# Patient Record
Sex: Male | Born: 1965 | Race: White | Marital: Single | Smoking: Never smoker
Health system: Southern US, Community
[De-identification: ages and names within clinical notes are randomized; demographics above are authoritative.]

## PROBLEM LIST (undated history)

## (undated) HISTORY — PX: CERVICAL SPINE SURGERY: SHX589

## (undated) HISTORY — PX: NASAL SEPTUM SURGERY: SHX37

---

## 2011-08-11 DIAGNOSIS — C4491 Basal cell carcinoma of skin, unspecified: Secondary | ICD-10-CM

## 2011-08-11 HISTORY — DX: Basal cell carcinoma of skin, unspecified: C44.91

## 2012-05-31 DIAGNOSIS — C439 Malignant melanoma of skin, unspecified: Secondary | ICD-10-CM

## 2012-05-31 HISTORY — DX: Malignant melanoma of skin, unspecified: C43.9

## 2012-09-29 ENCOUNTER — Ambulatory Visit
Admission: RE | Admit: 2012-09-29 | Discharge: 2012-09-29 | Disposition: A | Discharge status: Home / Self Care | Payer: BC Managed Care – PPO | Source: Ambulatory Visit | Attending: Family Medicine | Admitting: Family Medicine

## 2012-09-29 ENCOUNTER — Other Ambulatory Visit: Payer: Self-pay | Admitting: Family Medicine

## 2012-09-29 DIAGNOSIS — S300XXA Contusion of lower back and pelvis, initial encounter: Secondary | ICD-10-CM

## 2012-09-29 DIAGNOSIS — W19XXXA Unspecified fall, initial encounter: Secondary | ICD-10-CM

## 2015-02-05 ENCOUNTER — Other Ambulatory Visit: Payer: Self-pay | Admitting: Family Medicine

## 2015-02-05 DIAGNOSIS — M5412 Radiculopathy, cervical region: Secondary | ICD-10-CM

## 2015-02-11 ENCOUNTER — Other Ambulatory Visit: Payer: Self-pay

## 2015-05-08 ENCOUNTER — Other Ambulatory Visit: Payer: Self-pay | Admitting: Family Medicine

## 2015-05-08 DIAGNOSIS — M5412 Radiculopathy, cervical region: Secondary | ICD-10-CM

## 2015-05-09 ENCOUNTER — Ambulatory Visit
Admission: RE | Admit: 2015-05-09 | Discharge: 2015-05-09 | Disposition: A | Discharge status: Home / Self Care | Payer: BLUE CROSS/BLUE SHIELD | Source: Ambulatory Visit | Attending: Family Medicine | Admitting: Family Medicine

## 2015-05-09 DIAGNOSIS — M5412 Radiculopathy, cervical region: Secondary | ICD-10-CM

## 2015-08-11 ENCOUNTER — Ambulatory Visit
Admission: RE | Admit: 2015-08-11 | Discharge: 2015-08-11 | Disposition: A | Discharge status: Home / Self Care | Payer: BLUE CROSS/BLUE SHIELD | Source: Ambulatory Visit | Attending: Family Medicine | Admitting: Family Medicine

## 2015-08-11 ENCOUNTER — Other Ambulatory Visit: Payer: Self-pay | Admitting: Family Medicine

## 2015-08-11 DIAGNOSIS — R059 Cough, unspecified: Secondary | ICD-10-CM

## 2015-08-11 DIAGNOSIS — R05 Cough: Secondary | ICD-10-CM

## 2016-02-02 ENCOUNTER — Emergency Department (HOSPITAL_COMMUNITY)
Admission: EM | Admit: 2016-02-02 | Discharge: 2016-02-02 | Disposition: A | Discharge status: Home / Self Care | Payer: BLUE CROSS/BLUE SHIELD | Attending: Emergency Medicine | Admitting: Emergency Medicine

## 2016-02-02 ENCOUNTER — Encounter (HOSPITAL_COMMUNITY): Payer: Self-pay | Admitting: *Deleted

## 2016-02-02 DIAGNOSIS — R1013 Epigastric pain: Secondary | ICD-10-CM | POA: Diagnosis not present

## 2016-02-02 LAB — URINALYSIS, ROUTINE W REFLEX MICROSCOPIC
Bilirubin Urine: NEGATIVE
Glucose, UA: NEGATIVE mg/dL
Hgb urine dipstick: NEGATIVE
Ketones, ur: NEGATIVE mg/dL
Leukocytes, UA: NEGATIVE
Nitrite: NEGATIVE
Protein, ur: NEGATIVE mg/dL
Specific Gravity, Urine: 1.02 (ref 1.005–1.030)
pH: 7.5 (ref 5.0–8.0)

## 2016-02-02 LAB — COMPREHENSIVE METABOLIC PANEL
ALBUMIN: 3.8 g/dL (ref 3.5–5.0)
ALT: 28 U/L (ref 17–63)
ANION GAP: 8 (ref 5–15)
AST: 24 U/L (ref 15–41)
Alkaline Phosphatase: 80 U/L (ref 38–126)
BUN: 9 mg/dL (ref 6–20)
CALCIUM: 9.8 mg/dL (ref 8.9–10.3)
CHLORIDE: 106 mmol/L (ref 101–111)
CO2: 24 mmol/L (ref 22–32)
Creatinine, Ser: 1 mg/dL (ref 0.61–1.24)
GFR calc non Af Amer: >60 mL/min (ref >60)
GLUCOSE: 124 mg/dL — AB (ref 65–99)
POTASSIUM: 3.8 mmol/L (ref 3.5–5.1)
SODIUM: 138 mmol/L (ref 135–145)
Total Bilirubin: 0.5 mg/dL (ref 0.3–1.2)
Total Protein: 6.6 g/dL (ref 6.5–8.1)

## 2016-02-02 LAB — CBC
HEMATOCRIT: 44.4 % (ref 39.0–52.0)
HEMOGLOBIN: 15.2 g/dL (ref 13.0–17.0)
MCH: 29.2 pg (ref 26.0–34.0)
MCHC: 34.2 g/dL (ref 30.0–36.0)
MCV: 85.2 fL (ref 78.0–100.0)
Platelets: 220 10*3/uL (ref 150–400)
RBC: 5.21 MIL/uL (ref 4.22–5.81)
RDW: 12.3 % (ref 11.5–15.5)
WBC: 9 10*3/uL (ref 4.0–10.5)

## 2016-02-02 LAB — LIPASE, BLOOD: LIPASE: 39 U/L (ref 11–51)

## 2016-02-02 MED ORDER — RANITIDINE HCL 150 MG/10ML PO SYRP
300.0000 mg | ORAL_SOLUTION | Freq: Once | ORAL | Status: AC
  Administered 2016-02-02: 300 mg via ORAL
  Filled 2016-02-02: qty 20

## 2016-02-02 MED ORDER — SUCRALFATE 1 GM/10ML PO SUSP
1.0000 g | Freq: Once | ORAL | Status: AC
  Administered 2016-02-02: 1 g via ORAL
  Filled 2016-02-02: qty 10

## 2016-02-02 MED ORDER — SODIUM CHLORIDE 0.9 % IV BOLUS (SEPSIS)
1000.0000 mL | Freq: Once | INTRAVENOUS | Status: AC
  Administered 2016-02-02: 1000 mL via INTRAVENOUS

## 2016-02-02 MED ORDER — DICYCLOMINE HCL 10 MG PO CAPS
20.0000 mg | ORAL_CAPSULE | Freq: Once | ORAL | Status: AC
  Administered 2016-02-02: 20 mg via ORAL
  Filled 2016-02-02: qty 2

## 2016-02-02 NOTE — Discharge Instructions (Signed)
Abdominal Pain, Adult Jimmy Dominguez, your blood work tonight was normal.  Consider taking ranitidine over the counter as needed for your pain and see GI within 3 days for close follow up and further evaluation.  If any symptoms worsen, come back to the ED immediately. Thank you. Many things can cause belly (abdominal) pain. Most times, the belly pain is not dangerous. Many cases of belly pain can be watched and treated at home. HOME CARE   Do not take medicines that help you go poop (laxatives) unless told to by your doctor.  Only take medicine as told by your doctor.  Eat or drink as told by your doctor. Your doctor will tell you if you should be on a special diet. GET HELP IF:  You do not know what is causing your belly pain.  You have belly pain while you are sick to your stomach (nauseous) or have runny poop (diarrhea).  You have pain while you pee or poop.  Your belly pain wakes you up at night.  You have belly pain that gets worse or better when you eat.  You have belly pain that gets worse when you eat fatty foods.  You have a fever. GET HELP RIGHT AWAY IF:   The pain does not go away within 2 hours.  You keep throwing up (vomiting).  The pain changes and is only in the right or left part of the belly.  You have bloody or tarry looking poop. MAKE SURE YOU:   Understand these instructions.  Will watch your condition.  Will get help right away if you are not doing well or get worse.   This information is not intended to replace advice given to you by your health care provider. Make sure you discuss any questions you have with your health care provider.   Document Released: 02/02/2008 Document Revised: 09/06/2014 Document Reviewed: 04/25/2013 Elsevier Interactive Patient Education [employer].

## 2016-02-02 NOTE — ED Notes (Addendum)
Handed pt urinal to request sample, pt throws urinal on floor. Explained to pt that we would like a sample and handed to pt again, pt throws urinal on floor again.

## 2016-02-02 NOTE — ED Notes (Signed)
Pt c/o epigastric pain onset last night that subsided. Pt woke up this morning with severe pain unrelieved with Tums or nexium.

## 2016-02-02 NOTE — ED Provider Notes (Signed)
CSN: [id number]     Arrival date & time 02/02/16  [id number] History  By signing my name below, I, Rowan Blase, attest that this documentation has been prepared under the direction and in the presence of Everlene Balls, MD . Electronically Signed: Rowan Blase, Scribe. 02/02/2016. 3:56 AM.   Chief Complaint  Patient presents with  . Abdominal Pain   The history is provided by the patient. No language interpreter was used.   HPI Comments:  Jimmy Dominguez is a 50 y.o. male who presents to the Emergency Department complaining of "severe" burning epigastric abdominal pain onset ~5 hours ago while resting. Pt states pain might have been aggravated by food but his fiancee ate food from the same place without pain. No alleviating factors noted or treatments attempted PTA. Denies nausea, vomiting, diarrhea, dysuria, or h/o abdominal surgery.   History reviewed. No pertinent past medical history. Past Surgical History  Procedure Laterality Date  . Cervical spine surgery    . Nasal septum surgery     History reviewed. No pertinent family history. Social History  Substance Use Topics  . Smoking status: Never Smoker   . Smokeless tobacco: None  . Alcohol Use: Yes    Review of Systems  Gastrointestinal: Positive for abdominal pain. Negative for nausea, vomiting and diarrhea.  Genitourinary: Negative for dysuria.  All other systems reviewed and are negative.   Allergies  Review of patient's allergies indicates no known allergies.  Home Medications   Prior to Admission medications   Not on File   BP 141/79 mmHg  Pulse 67  Temp(Src) 97.6 F (36.4 C)  Resp 18  Ht 5\' 10"  (1.778 m)  Wt 216 lb (97.977 kg)  BMI 30.99 kg/m2  SpO2 99%   Physical Exam  Constitutional: He is oriented to person, place, and time. Vital signs are normal. He appears well-developed and well-nourished.  Non-toxic appearance. He does not appear ill. No distress.  HENT:  Head: Normocephalic and atraumatic.  Nose:  Nose normal.  Mouth/Throat: Oropharynx is clear and moist. No oropharyngeal exudate.  Eyes: Conjunctivae and EOM are normal. Pupils are equal, round, and reactive to light. No scleral icterus.  Neck: Normal range of motion. Neck supple. No tracheal deviation, no edema, no erythema and normal range of motion present. No thyroid mass and no thyromegaly present.  Cardiovascular: Normal rate, regular rhythm, S1 normal, S2 normal, normal heart sounds, intact distal pulses and normal pulses.  Exam reveals no gallop and no friction rub.   No murmur heard. Pulmonary/Chest: Effort normal and breath sounds normal. No respiratory distress. He has no wheezes. He has no rhonchi. He has no rales.  Abdominal: Soft. Normal appearance and bowel sounds are normal. He exhibits no distension, no ascites and no mass. There is no hepatosplenomegaly. There is tenderness. There is no rebound, no guarding and no CVA tenderness.  Mid epigastric TTP  Musculoskeletal: Normal range of motion. He exhibits no edema or tenderness.  Lymphadenopathy:    He has no cervical adenopathy.  Neurological: He is alert and oriented to person, place, and time. He has normal strength. No cranial nerve deficit or sensory deficit.  Skin: Skin is warm, dry and intact. No petechiae and no rash noted. He is not diaphoretic. No erythema. No pallor.  Psychiatric: He has a normal mood and affect. His behavior is normal. Judgment normal.  Nursing note and vitals reviewed.   ED Course  Procedures  DIAGNOSTIC STUDIES:  Oxygen Saturation is 99% on RA, normal by  my interpretation.    COORDINATION OF CARE:  3:55 AM Will order blood work. Discussed treatment plan with pt at bedside and pt agreed to plan.  Labs Review Labs Reviewed  COMPREHENSIVE METABOLIC PANEL - Abnormal; Notable for the following:    Glucose, Bld 124 (*)    All other components within normal limits  LIPASE, BLOOD  CBC  URINALYSIS, ROUTINE W REFLEX MICROSCOPIC (NOT AT  [facility])    Imaging Review No results found. I have personally reviewed and evaluated these images and lab results as part of my medical decision-making.   EKG Interpretation None      MDM   Final diagnoses:  None   Patient presents to the ED for midepigastric abdominal pain after a meal.  History concerning for gastritis. He was given bentyl, ranitidine, sucralfate.  Labs are unremarkable.    Upon repeat evaluation, patient states his pain has significantly improved.  Education provided regarding gastritis.  He states he has fu with GI within 1 week for colonoscopy, he was encouraged to enquire about EGD.  He appears well and in NAD. VS remain within his normal limits and he i safe for DC.   I personally performed the services described in this documentation, which was scribed in my presence. The recorded information has been reviewed and is accurate.      Everlene Balls, MD 02/02/16 [phone]

## 2016-02-05 ENCOUNTER — Telehealth: Payer: Self-pay | Admitting: Gastroenterology

## 2016-02-05 DIAGNOSIS — Z1211 Encounter for screening for malignant neoplasm of colon: Secondary | ICD-10-CM | POA: Diagnosis not present

## 2016-02-05 DIAGNOSIS — Z01818 Encounter for other preprocedural examination: Secondary | ICD-10-CM | POA: Diagnosis not present

## 2016-02-05 DIAGNOSIS — R1013 Epigastric pain: Secondary | ICD-10-CM | POA: Diagnosis not present

## 2016-02-05 NOTE — Telephone Encounter (Signed)
Doc of Day  Urgent Referral in EPIC from [facility] ED wanting the patient seen in 3 days.  We are unassigned for [facility].  advise

## 2016-02-05 NOTE — Telephone Encounter (Signed)
I spoke with Jimmy Dominguez.  He states he has a referral and an appt to another GI group.  He declines to schedule.

## 2016-02-27 DIAGNOSIS — Z1211 Encounter for screening for malignant neoplasm of colon: Secondary | ICD-10-CM | POA: Diagnosis not present

## 2016-02-27 DIAGNOSIS — R933 Abnormal findings on diagnostic imaging of other parts of digestive tract: Secondary | ICD-10-CM | POA: Diagnosis not present

## 2016-02-27 DIAGNOSIS — K640 First degree hemorrhoids: Secondary | ICD-10-CM | POA: Diagnosis not present

## 2016-02-27 DIAGNOSIS — R1013 Epigastric pain: Secondary | ICD-10-CM | POA: Diagnosis not present

## 2016-07-28 DIAGNOSIS — H5212 Myopia, left eye: Secondary | ICD-10-CM | POA: Diagnosis not present

## 2016-07-28 DIAGNOSIS — H524 Presbyopia: Secondary | ICD-10-CM | POA: Diagnosis not present

## 2016-10-07 DIAGNOSIS — Z0000 Encounter for general adult medical examination without abnormal findings: Secondary | ICD-10-CM | POA: Diagnosis not present

## 2016-10-07 DIAGNOSIS — E782 Mixed hyperlipidemia: Secondary | ICD-10-CM | POA: Diagnosis not present

## 2016-10-07 DIAGNOSIS — Z125 Encounter for screening for malignant neoplasm of prostate: Secondary | ICD-10-CM | POA: Diagnosis not present

## 2017-04-16 DIAGNOSIS — W208XXA Other cause of strike by thrown, projected or falling object, initial encounter: Secondary | ICD-10-CM | POA: Diagnosis not present

## 2017-04-16 DIAGNOSIS — Y998 Other external cause status: Secondary | ICD-10-CM | POA: Diagnosis not present

## 2017-04-16 DIAGNOSIS — Y9389 Activity, other specified: Secondary | ICD-10-CM | POA: Diagnosis not present

## 2017-04-16 DIAGNOSIS — Y9289 Other specified places as the place of occurrence of the external cause: Secondary | ICD-10-CM | POA: Diagnosis not present

## 2017-04-16 DIAGNOSIS — S0181XA Laceration without foreign body of other part of head, initial encounter: Secondary | ICD-10-CM | POA: Diagnosis not present

## 2017-05-31 IMAGING — MR MR CERVICAL SPINE W/O CM
5 series · 29 of 48 positions shown · non-contrast
Comparison: None.

CLINICAL DATA: Right-sided neck, shoulder, and upper back pain
beginning 5 months ago. The pain is progressive.

EXAM:
MRI CERVICAL SPINE WITHOUT CONTRAST
TECHNIQUE: Multiplanar, multisequence MR imaging of the cervical spine was
performed. No intravenous contrast was administered.

[Series 2: T2 · sagittal · 3.3mm · 0.41mm/px · 6 of 12 slices shown (1 of 2)]
[im 1/12]
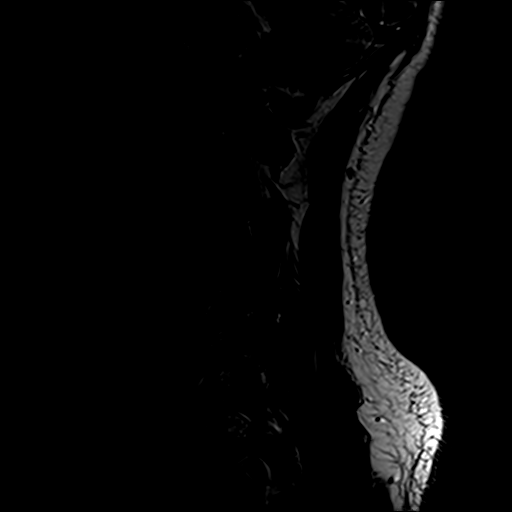
[im 3/12]
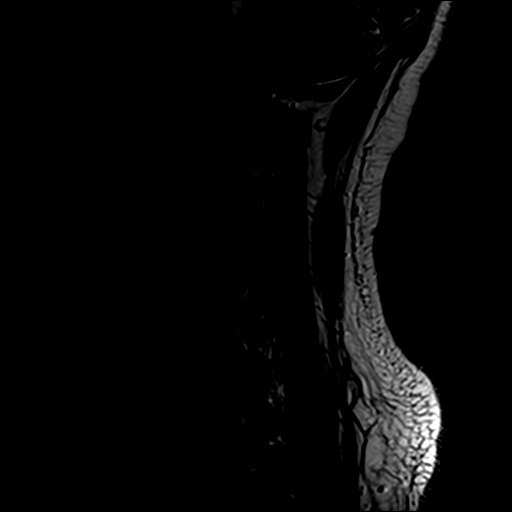
[im 5/12]
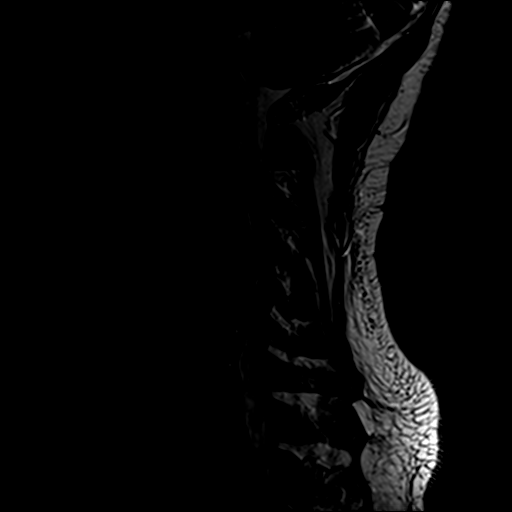
[im 7/12]
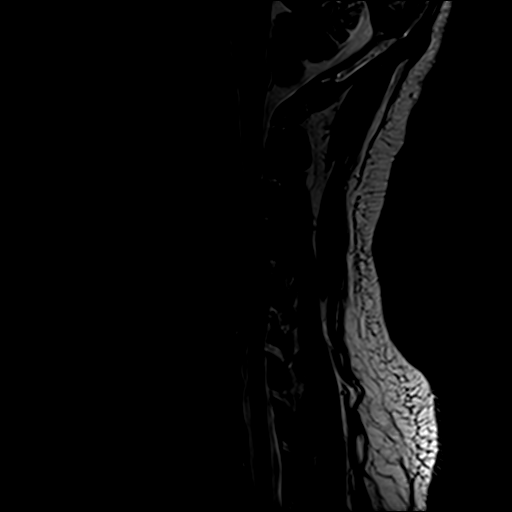
[im 9/12]
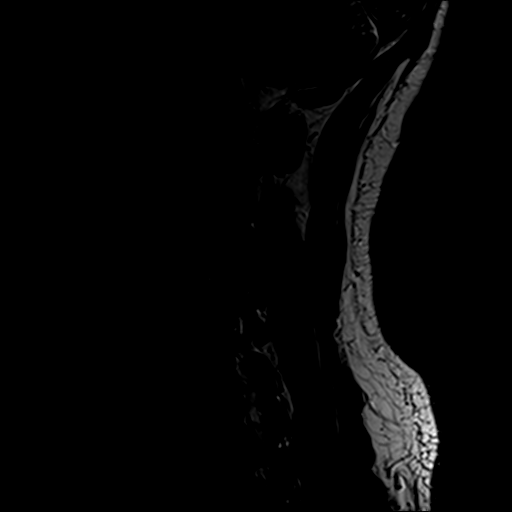
[im 12/12]
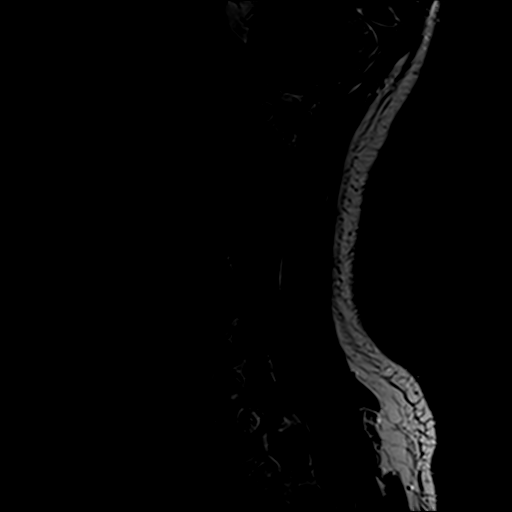

[Series 3: T1 · sagittal · 3.3mm · 0.41mm/px · 7 of 12 slices shown]
[im 1/12]
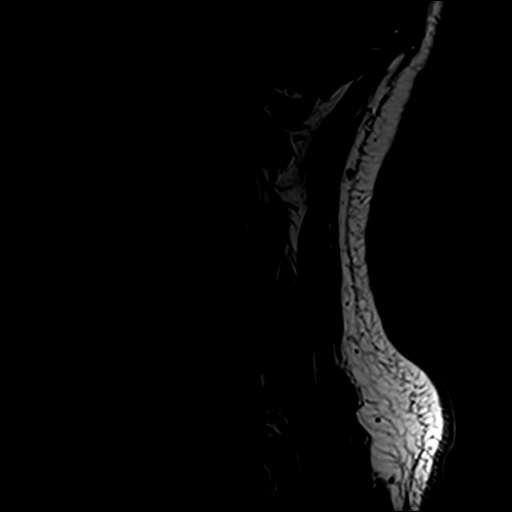
[im 2/12]
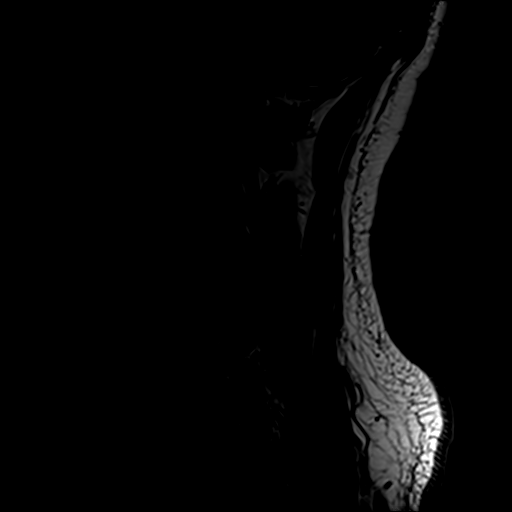
[im 4/12]
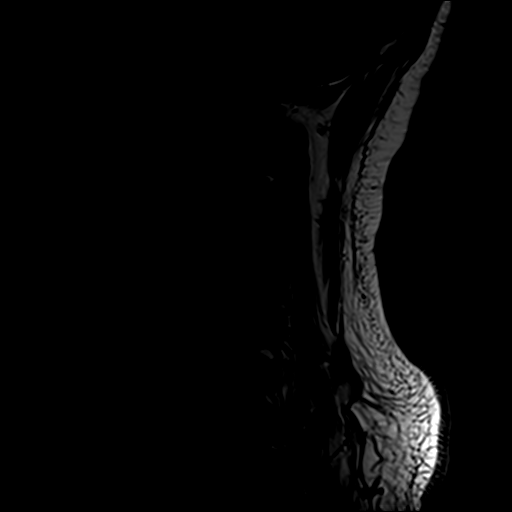
[im 6/12]
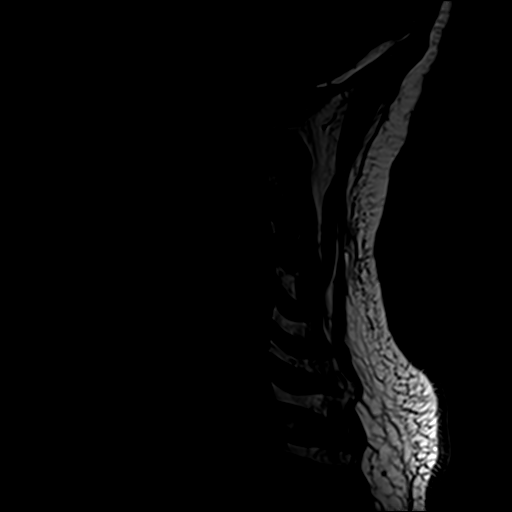
[im 8/12]
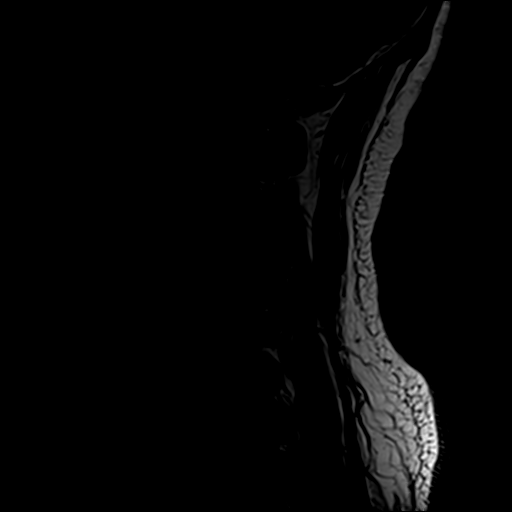
[im 10/12]
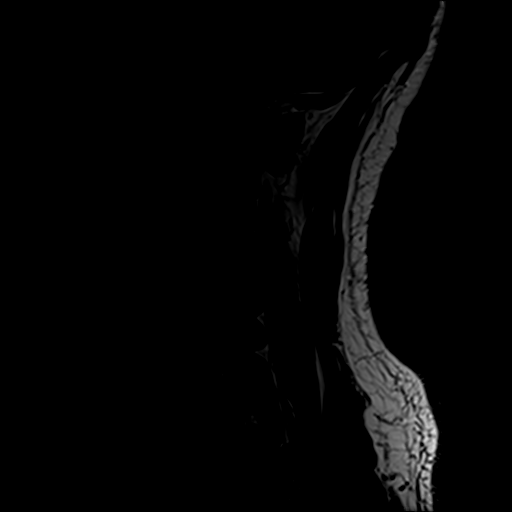
[im 12/12]
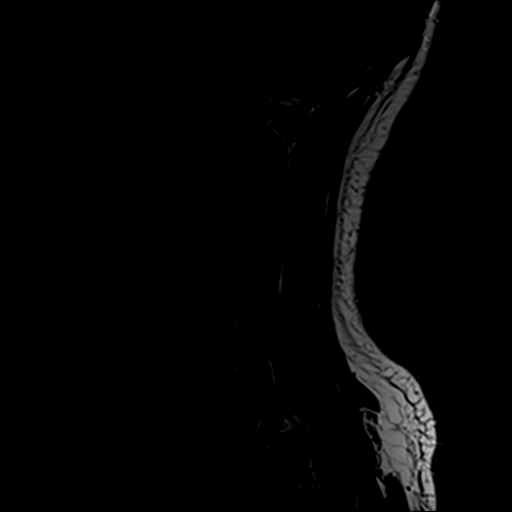

[Series 4: STIR · sagittal · 3.3mm · 0.82mm/px · 7 of 12 slices shown]
[im 1/12]
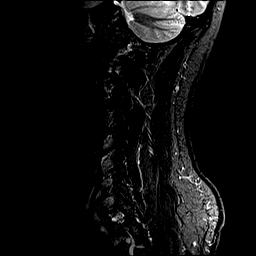
[im 2/12]
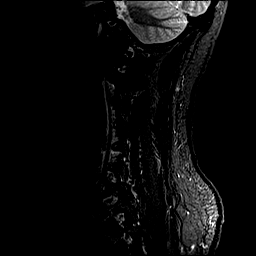
[im 4/12]
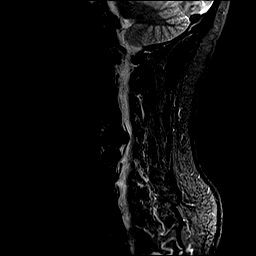
[im 6/12]
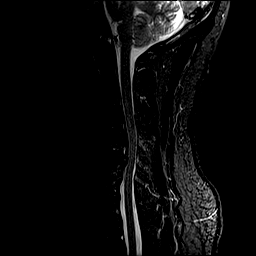
[im 8/12]
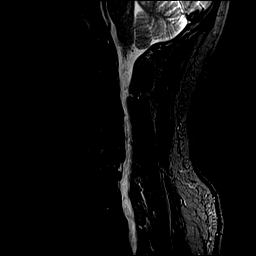
[im 10/12]
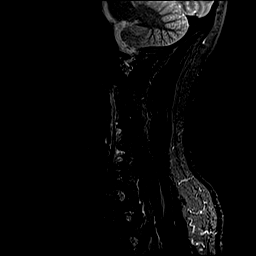
[im 12/12]
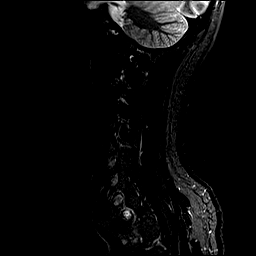

[Series 5: GRE · axial · 3.0mm · 0.35mm/px · 1 of 26 slices shown]
[im 1/26]
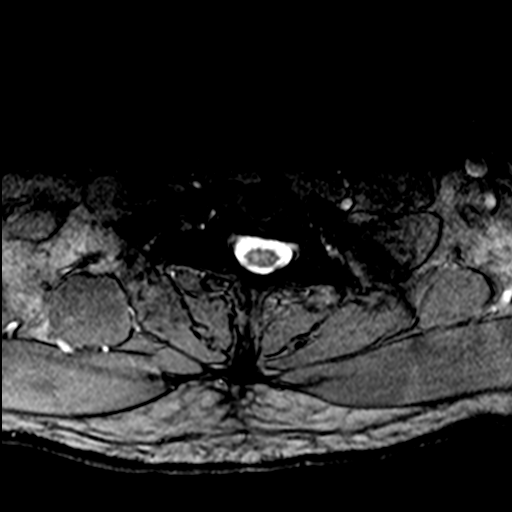

[Series 6: T2 · axial · 3.0mm · 0.70mm/px · z∈[-86,+6]mm · 8 of 26 slices shown (2 of 2)]
[im 1/26]
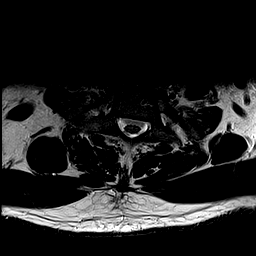
[im 4/26]
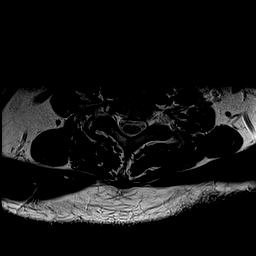
[im 8/26]
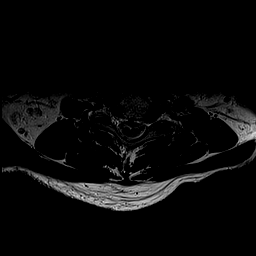
[im 12/26]
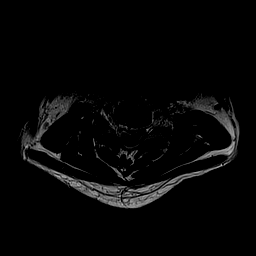
[im 14/26]
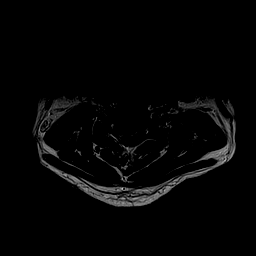
[im 18/26]
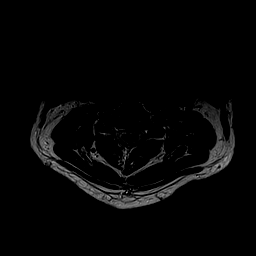
[im 22/26]
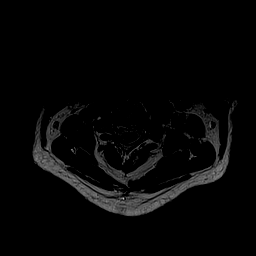
[im 26/26]
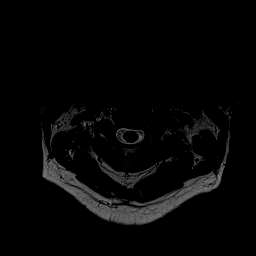

[29 of 48 positions shown; findings below may reference images not displayed]

FINDINGS: Normal signal is present in the cervical and upper thoracic spinal
cord to the lowest imaged level, and T1-2. Chronic endplate marrow
changes are evident at C4-5, C5-6, and C6-7. There is chronic loss
of disc height at C5-6 and C6-7. There straightening and some
reversal of the normal cervical lordosis. Craniocervical junction is
within normal limits. The visualized intracranial contents are
normal.

The vertebral arteries are patent bilaterally.

C2-3: Asymmetric left-sided facet hypertrophy contributes to mild
left foraminal narrowing. The central canal is patent.

C3-4: A mild broad-based disc osteophyte complex is present.
Uncovertebral and spurring is noted bilaterally. There is partial
effacement of the ventral CSF. Moderate left and mild right
foraminal narrowing is present.

C4-5: A rightward disc protrusion is noted. This results in severe
right foraminal stenosis and moderate right central canal stenosis.
Uncovertebral spurring contributes to mild left foraminal narrowing.

C5-6: A prominent central disc protrusion distorts the ventral
surface of the cord. The canal is narrowed to 6 mm. Mild left
foraminal narrowing is present. The right foramen is patent.

C6-7: A broad-based disc protrusion is asymmetric to the left. There
is partial effacement of the ventral CSF. Moderate left and mild
right foraminal narrowing is present.

C7-T1:  Negative.
IMPRESSION: 1. The most severe right-sided disease at C4-5 were a rightward disc
protrusion results in severe right foraminal stenosis and moderate
right central canal narrowing.
2. Central disc protrusion at C5-6 results and moderate central
canal stenosis with narrowing of the canal to 6 mm.
3. Mild focal left foraminal narrowing at C5-6.
4. Moderate left and mild right foraminal narrowing at C3-4 and
C6-7.
5. Mild central canal narrowing at C3-4 and C6-7.
6. Mild left foraminal narrowing at C2-3.

## 2017-10-26 DIAGNOSIS — Z0000 Encounter for general adult medical examination without abnormal findings: Secondary | ICD-10-CM | POA: Diagnosis not present

## 2017-10-26 DIAGNOSIS — E782 Mixed hyperlipidemia: Secondary | ICD-10-CM | POA: Diagnosis not present

## 2017-10-26 DIAGNOSIS — Z125 Encounter for screening for malignant neoplasm of prostate: Secondary | ICD-10-CM | POA: Diagnosis not present

## 2017-10-26 DIAGNOSIS — H524 Presbyopia: Secondary | ICD-10-CM | POA: Diagnosis not present

## 2017-12-15 DIAGNOSIS — Z31441 Encounter for testing of male partner of patient with recurrent pregnancy loss: Secondary | ICD-10-CM | POA: Diagnosis not present

## 2017-12-15 DIAGNOSIS — Z113 Encounter for screening for infections with a predominantly sexual mode of transmission: Secondary | ICD-10-CM | POA: Diagnosis not present

## 2017-12-27 DIAGNOSIS — Z3141 Encounter for fertility testing: Secondary | ICD-10-CM | POA: Diagnosis not present

## 2018-07-04 DIAGNOSIS — L0211 Cutaneous abscess of neck: Secondary | ICD-10-CM | POA: Diagnosis not present

## 2018-07-04 DIAGNOSIS — L089 Local infection of the skin and subcutaneous tissue, unspecified: Secondary | ICD-10-CM | POA: Diagnosis not present

## 2018-07-31 DIAGNOSIS — Z85820 Personal history of malignant melanoma of skin: Secondary | ICD-10-CM | POA: Diagnosis not present

## 2018-07-31 DIAGNOSIS — D229 Melanocytic nevi, unspecified: Secondary | ICD-10-CM | POA: Diagnosis not present

## 2018-10-16 DIAGNOSIS — M71572 Other bursitis, not elsewhere classified, left ankle and foot: Secondary | ICD-10-CM | POA: Diagnosis not present

## 2018-10-16 DIAGNOSIS — M71571 Other bursitis, not elsewhere classified, right ankle and foot: Secondary | ICD-10-CM | POA: Diagnosis not present

## 2018-10-16 DIAGNOSIS — M722 Plantar fascial fibromatosis: Secondary | ICD-10-CM | POA: Diagnosis not present

## 2018-10-16 DIAGNOSIS — M7731 Calcaneal spur, right foot: Secondary | ICD-10-CM | POA: Diagnosis not present

## 2018-10-16 DIAGNOSIS — M7732 Calcaneal spur, left foot: Secondary | ICD-10-CM | POA: Diagnosis not present

## 2018-10-23 DIAGNOSIS — M71572 Other bursitis, not elsewhere classified, left ankle and foot: Secondary | ICD-10-CM | POA: Diagnosis not present

## 2018-10-23 DIAGNOSIS — M71571 Other bursitis, not elsewhere classified, right ankle and foot: Secondary | ICD-10-CM | POA: Diagnosis not present

## 2018-10-23 DIAGNOSIS — M722 Plantar fascial fibromatosis: Secondary | ICD-10-CM | POA: Diagnosis not present

## 2018-10-24 DIAGNOSIS — L723 Sebaceous cyst: Secondary | ICD-10-CM | POA: Diagnosis not present

## 2018-10-24 DIAGNOSIS — L570 Actinic keratosis: Secondary | ICD-10-CM | POA: Diagnosis not present

## 2018-10-27 DIAGNOSIS — Z0000 Encounter for general adult medical examination without abnormal findings: Secondary | ICD-10-CM | POA: Diagnosis not present

## 2018-10-27 DIAGNOSIS — R5383 Other fatigue: Secondary | ICD-10-CM | POA: Diagnosis not present

## 2018-10-27 DIAGNOSIS — E782 Mixed hyperlipidemia: Secondary | ICD-10-CM | POA: Diagnosis not present

## 2018-10-27 DIAGNOSIS — R0602 Shortness of breath: Secondary | ICD-10-CM | POA: Diagnosis not present

## 2018-10-30 DIAGNOSIS — M71571 Other bursitis, not elsewhere classified, right ankle and foot: Secondary | ICD-10-CM | POA: Diagnosis not present

## 2018-10-30 DIAGNOSIS — M722 Plantar fascial fibromatosis: Secondary | ICD-10-CM | POA: Diagnosis not present

## 2018-10-30 DIAGNOSIS — M71572 Other bursitis, not elsewhere classified, left ankle and foot: Secondary | ICD-10-CM | POA: Diagnosis not present

## 2018-11-01 ENCOUNTER — Other Ambulatory Visit: Payer: Self-pay | Admitting: Family Medicine

## 2018-11-01 DIAGNOSIS — N50811 Right testicular pain: Secondary | ICD-10-CM

## 2018-11-14 ENCOUNTER — Other Ambulatory Visit: Payer: Self-pay

## 2018-12-01 ENCOUNTER — Other Ambulatory Visit: Payer: BLUE CROSS/BLUE SHIELD

## 2019-01-01 DIAGNOSIS — G4733 Obstructive sleep apnea (adult) (pediatric): Secondary | ICD-10-CM | POA: Diagnosis not present

## 2019-01-08 DIAGNOSIS — H524 Presbyopia: Secondary | ICD-10-CM | POA: Diagnosis not present

## 2019-01-16 ENCOUNTER — Other Ambulatory Visit: Payer: BLUE CROSS/BLUE SHIELD

## 2019-01-24 ENCOUNTER — Ambulatory Visit
Admission: RE | Admit: 2019-01-24 | Discharge: 2019-01-24 | Disposition: A | Discharge status: Home / Self Care | Payer: BLUE CROSS/BLUE SHIELD | Source: Ambulatory Visit | Attending: Family Medicine | Admitting: Family Medicine

## 2019-01-24 DIAGNOSIS — N50811 Right testicular pain: Secondary | ICD-10-CM | POA: Diagnosis not present

## 2019-02-19 ENCOUNTER — Other Ambulatory Visit: Payer: BLUE CROSS/BLUE SHIELD

## 2019-05-25 DIAGNOSIS — Z1159 Encounter for screening for other viral diseases: Secondary | ICD-10-CM | POA: Diagnosis not present

## 2019-05-25 DIAGNOSIS — J069 Acute upper respiratory infection, unspecified: Secondary | ICD-10-CM | POA: Diagnosis not present

## 2019-06-04 ENCOUNTER — Other Ambulatory Visit: Payer: Self-pay

## 2019-06-04 DIAGNOSIS — Z20822 Contact with and (suspected) exposure to covid-19: Secondary | ICD-10-CM

## 2019-06-04 DIAGNOSIS — Z20828 Contact with and (suspected) exposure to other viral communicable diseases: Secondary | ICD-10-CM | POA: Diagnosis not present

## 2019-06-06 DIAGNOSIS — R05 Cough: Secondary | ICD-10-CM | POA: Diagnosis not present

## 2019-06-06 DIAGNOSIS — U071 COVID-19: Secondary | ICD-10-CM | POA: Diagnosis not present

## 2019-06-06 LAB — NOVEL CORONAVIRUS, NAA: SARS-CoV-2, NAA: NOT DETECTED

## 2019-06-26 ENCOUNTER — Ambulatory Visit
Admission: RE | Admit: 2019-06-26 | Discharge: 2019-06-26 | Disposition: A | Discharge status: Home / Self Care | Payer: BLUE CROSS/BLUE SHIELD | Source: Ambulatory Visit | Attending: Family Medicine | Admitting: Family Medicine

## 2019-06-26 ENCOUNTER — Other Ambulatory Visit: Payer: Self-pay | Admitting: Family Medicine

## 2019-06-26 DIAGNOSIS — R05 Cough: Secondary | ICD-10-CM

## 2019-06-26 DIAGNOSIS — Z23 Encounter for immunization: Secondary | ICD-10-CM | POA: Diagnosis not present

## 2019-06-26 DIAGNOSIS — R059 Cough, unspecified: Secondary | ICD-10-CM

## 2019-08-03 DIAGNOSIS — M7582 Other shoulder lesions, left shoulder: Secondary | ICD-10-CM | POA: Diagnosis not present

## 2019-08-21 ENCOUNTER — Ambulatory Visit: Payer: BC Managed Care – PPO | Attending: Internal Medicine

## 2019-08-21 DIAGNOSIS — Z20828 Contact with and (suspected) exposure to other viral communicable diseases: Secondary | ICD-10-CM | POA: Diagnosis not present

## 2019-08-21 DIAGNOSIS — Z20822 Contact with and (suspected) exposure to covid-19: Secondary | ICD-10-CM

## 2019-08-24 LAB — NOVEL CORONAVIRUS, NAA: SARS-CoV-2, NAA: NOT DETECTED

## 2019-11-01 DIAGNOSIS — E782 Mixed hyperlipidemia: Secondary | ICD-10-CM | POA: Diagnosis not present

## 2019-11-01 DIAGNOSIS — Z125 Encounter for screening for malignant neoplasm of prostate: Secondary | ICD-10-CM | POA: Diagnosis not present

## 2019-11-01 DIAGNOSIS — Z0000 Encounter for general adult medical examination without abnormal findings: Secondary | ICD-10-CM | POA: Diagnosis not present

## 2019-11-01 DIAGNOSIS — E291 Testicular hypofunction: Secondary | ICD-10-CM | POA: Diagnosis not present

## 2019-11-09 DIAGNOSIS — Z13220 Encounter for screening for lipoid disorders: Secondary | ICD-10-CM | POA: Diagnosis not present

## 2019-11-22 DIAGNOSIS — M10042 Idiopathic gout, left hand: Secondary | ICD-10-CM | POA: Diagnosis not present

## 2019-11-29 DIAGNOSIS — R2231 Localized swelling, mass and lump, right upper limb: Secondary | ICD-10-CM | POA: Diagnosis not present

## 2020-01-02 DIAGNOSIS — R2231 Localized swelling, mass and lump, right upper limb: Secondary | ICD-10-CM | POA: Diagnosis not present

## 2020-01-26 DIAGNOSIS — Z20828 Contact with and (suspected) exposure to other viral communicable diseases: Secondary | ICD-10-CM | POA: Diagnosis not present

## 2020-04-12 DIAGNOSIS — Z1152 Encounter for screening for COVID-19: Secondary | ICD-10-CM | POA: Diagnosis not present

## 2020-04-12 DIAGNOSIS — Z1159 Encounter for screening for other viral diseases: Secondary | ICD-10-CM | POA: Diagnosis not present

## 2020-04-12 DIAGNOSIS — Z03818 Encounter for observation for suspected exposure to other biological agents ruled out: Secondary | ICD-10-CM | POA: Diagnosis not present

## 2020-05-22 DIAGNOSIS — D485 Neoplasm of uncertain behavior of skin: Secondary | ICD-10-CM | POA: Diagnosis not present

## 2020-05-22 DIAGNOSIS — C4490 Unspecified malignant neoplasm of skin, unspecified: Secondary | ICD-10-CM | POA: Diagnosis not present

## 2020-05-26 DIAGNOSIS — C4490 Unspecified malignant neoplasm of skin, unspecified: Secondary | ICD-10-CM | POA: Diagnosis not present

## 2020-05-26 DIAGNOSIS — D485 Neoplasm of uncertain behavior of skin: Secondary | ICD-10-CM | POA: Diagnosis not present

## 2020-05-27 DIAGNOSIS — L821 Other seborrheic keratosis: Secondary | ICD-10-CM | POA: Diagnosis not present

## 2020-06-07 DIAGNOSIS — Z1152 Encounter for screening for COVID-19: Secondary | ICD-10-CM | POA: Diagnosis not present

## 2020-06-29 DIAGNOSIS — Z20822 Contact with and (suspected) exposure to covid-19: Secondary | ICD-10-CM | POA: Diagnosis not present

## 2020-07-30 ENCOUNTER — Ambulatory Visit: Payer: BC Managed Care – PPO | Admitting: Dermatology

## 2020-09-12 DIAGNOSIS — Z1152 Encounter for screening for COVID-19: Secondary | ICD-10-CM | POA: Diagnosis not present

## 2020-10-14 DIAGNOSIS — Z0000 Encounter for general adult medical examination without abnormal findings: Secondary | ICD-10-CM | POA: Diagnosis not present

## 2020-10-15 ENCOUNTER — Encounter: Payer: Self-pay | Admitting: Dermatology

## 2020-10-15 ENCOUNTER — Ambulatory Visit: Payer: BC Managed Care – PPO | Admitting: Dermatology

## 2020-10-15 ENCOUNTER — Other Ambulatory Visit: Payer: Self-pay

## 2020-10-15 DIAGNOSIS — Z85828 Personal history of other malignant neoplasm of skin: Secondary | ICD-10-CM | POA: Diagnosis not present

## 2020-10-15 DIAGNOSIS — L820 Inflamed seborrheic keratosis: Secondary | ICD-10-CM | POA: Diagnosis not present

## 2020-10-15 DIAGNOSIS — Z85820 Personal history of malignant melanoma of skin: Secondary | ICD-10-CM

## 2020-10-15 DIAGNOSIS — Z1283 Encounter for screening for malignant neoplasm of skin: Secondary | ICD-10-CM

## 2020-10-15 DIAGNOSIS — D485 Neoplasm of uncertain behavior of skin: Secondary | ICD-10-CM

## 2020-10-15 NOTE — Patient Instructions (Signed)

## 2020-10-16 ENCOUNTER — Telehealth: Payer: Self-pay

## 2020-10-16 DIAGNOSIS — H524 Presbyopia: Secondary | ICD-10-CM | POA: Diagnosis not present

## 2020-10-16 DIAGNOSIS — H5212 Myopia, left eye: Secondary | ICD-10-CM | POA: Diagnosis not present

## 2020-10-16 DIAGNOSIS — H11002 Unspecified pterygium of left eye: Secondary | ICD-10-CM | POA: Diagnosis not present

## 2020-10-16 NOTE — Telephone Encounter (Signed)
NOTES ON FILE FROM [facility] [phone], SENT REFERRAL TO SCHEDULING 

## 2020-10-19 ENCOUNTER — Encounter: Payer: Self-pay | Admitting: Dermatology

## 2020-10-19 DIAGNOSIS — E785 Hyperlipidemia, unspecified: Secondary | ICD-10-CM | POA: Insufficient documentation

## 2020-10-19 DIAGNOSIS — R0602 Shortness of breath: Secondary | ICD-10-CM | POA: Insufficient documentation

## 2020-10-19 NOTE — Progress Notes (Signed)
Cardiology Office Note   Date:  10/20/2020   ID:  Jimmy Dominguez, DOB [birth year], MRN [id number]  PCP:  Jimmy Amel, MD  Cardiologist:   No primary care provider on file. Referring:  Jimmy Amel, MD  Chief Complaint  Patient presents with  . Chest Pain      History of Present Illness: Jimmy Dominguez is a 55 y.o. male who is referred by Jimmy Amel, MD for evaluation of SOB.  He has had COVID.  Prior to getting Covid in October he said he was fine.  Since then he has had some mild chest discomfort, heart racing and shortness of breath.  This all seems to happen at the same time.  He notices it sometimes when he wakes up.  He is felt his heart rate and taking it on his Jodelle Red and noted to be about 100.  He has not recorded an oxygen saturation.  The chest discomfort is mild and across his chest but not associated with nausea vomiting or diaphoresis.  There is no neck or arm pain.  He is not had any presyncope or syncope.  He cannot bring this on though he is sedentary.  He does climb stairs without bringing his arm.  He has no PND or orthopnea.  He sleeps with a CPAP.  He never had the symptoms prior.  He is currently cardiac testing.  Of note his COVID was severe but he was not hospitalized.  Past Medical History:  Diagnosis Date  . Basal cell carcinoma 08/11/2011   RIGHT CHEEK TX MOHS  . Melanoma ([place]) 05/31/2012   MM LEVEL 2 RIGHT INNER KNEE TX-EXC    Past Surgical History:  Procedure Laterality Date  . CERVICAL SPINE SURGERY    . NASAL SEPTUM SURGERY       Current Outpatient Medications  Medication Sig Dispense Refill  . esomeprazole (NEXIUM) 20 MG capsule Take 20 mg by mouth daily at 12 noon.     No current facility-administered medications for this visit.    Allergies:   Patient has no known allergies.    Social History:  The patient  reports that he has never smoked. He has never used smokeless tobacco. He reports current alcohol use. He reports that he  does not use drugs.   Family History:  The patient's family history includes Prostate cancer in his father.   His father did apparently have stent at a later age   ROS:  Please see the history of present illness.   Otherwise, review of systems are positive for none.   All other systems are reviewed and negative.    PHYSICAL EXAM: VS:  BP 114/72   Pulse 76   Ht 5' 10.5" (1.791 m)   Wt 214 lb 9.6 oz (97.3 kg)   BMI 30.36 kg/m  , BMI Body mass index is 30.36 kg/m. GENERAL:  Well appearing HEENT:  Pupils equal round and reactive, fundi not visualized, oral mucosa unremarkable NECK:  No jugular venous distention, waveform within normal limits, carotid upstroke brisk and symmetric, no bruits, no thyromegaly LYMPHATICS:  No cervical, inguinal adenopathy LUNGS:  Clear to auscultation bilaterally BACK:  No CVA tenderness CHEST:  Unremarkable HEART:  PMI not displaced or sustained,S1 and S2 within normal limits, no S3, no S4, no clicks, no rubs, no murmurs ABD:  Flat, positive bowel sounds normal in frequency in pitch, no bruits, no rebound, no guarding, no midline pulsatile mass, no hepatomegaly, no splenomegaly EXT:  2  plus pulses throughout, no edema, no cyanosis no clubbing SKIN:  No rashes no nodules NEURO:  Cranial nerves II through XII grossly intact, motor grossly intact throughout PSYCH:  Cognitively intact, oriented to person place and time    EKG:  EKG is ordered today. The ekg ordered today demonstrates NSR rate 76, rightward axis, intervals within normal limits, no acute ST-T wave changes.   Recent Labs: No results found for requested labs within last 8760 hours.    Lipid Panel No results found for: CHOL, TRIG, HDL, CHOLHDL, VLDL, LDLCALC, LDLDIRECT    Wt Readings from Last 3 Encounters:  10/20/20 214 lb 9.6 oz (97.3 kg)  02/02/16 216 lb (98 kg)      Other studies Reviewed: Additional studies/ records that were reviewed today include: Labs. Review of the above  records demonstrates:  Please see elsewhere in the note.     ASSESSMENT AND PLAN:  SOB: I doubt that this is related to his COVID. I am going to start with a BNP level. He has a normal exam. This comes back normal this will otherwise be evaluated as below.  CHEST PAIN:  I will bring the patient back for a POET (Plain Old Exercise Test). This will allow me to screen for obstructive coronary disease, risk stratify and very importantly provide a prescription for exercise. I will also check coronary calcium score  PALPITATIONS:  He can continue to monitor with his cardia  DYSLIPIDEMIA: Further goals of therapy will be based on the results of the above.  Current medicines are reviewed at length with the patient today.  The patient does not have concerns regarding medicines.  The following changes have been made:  no change  Labs/ tests ordered today include:   Orders Placed This Encounter  Procedures  . CT CARDIAC SCORING (SELF PAY ONLY)  . Brain natriuretic peptide  . EXERCISE TOLERANCE TEST (ETT)  . EKG 12-Lead     Disposition:   FU with me as needed.      Signed, Minus Breeding, MD  10/20/2020 8:38 AM     [facility]

## 2020-10-19 NOTE — Progress Notes (Signed)
   Follow-Up Visit   Subjective  Jimmy Dominguez is a 55 y.o. male who presents for the following: Annual Exam (NO REAL CONCERNS, RECHECK SPOT ON THE BACK DR Denna Haggard [place]).  Growth on right upper back Location:  Duration:  Quality:  Associated Signs/Symptoms: Modifying Factors:  Severity:  Timing: Context: Would like general skin examination; history of nonmelanoma skin cancer and melanoma.  Objective  Well appearing patient in no apparent distress; mood and affect are within normal limits. Objective  Head - Anterior (Face): No sign residual  Objective  Right Knee - Anterior: Sign me pigmentation, no regional adenopathy.  Objective  Chest - Medial [facility]): Full body skin examination: No atypical moles or melanoma.  Objective  Left Upper Back: Pink tan-pink 5 mm crust, I SK versus hypertrophic CIS.       A full examination was performed including scalp, head, eyes, ears, nose, lips, neck, chest, axillae, abdomen, back, buttocks, bilateral upper extremities, bilateral lower extremities, hands, feet, fingers, toes, fingernails, and toenails. All findings within normal limits unless otherwise noted below.   Assessment & Plan    History of basal cell carcinoma (BCC) Head - Anterior (Face)  Recheck as needed change  Personal history of malignant melanoma of skin Right Knee - Anterior  Yearly skin check plus patient encouraged to self examine with his spouse twice annually  Skin exam for malignant neoplasm Chest - Medial (Center)  Yearly skin check  Neoplasm of uncertain behavior of skin Left Upper Back  Skin / nail biopsy Type of biopsy: tangential   Informed consent: discussed and consent obtained   Timeout: patient name, date of birth, surgical site, and procedure verified   Procedure prep:  Patient was prepped and draped in usual sterile fashion (Non sterile) Prep type:  Chlorhexidine Anesthesia: the lesion was anesthetized in a standard  fashion   Anesthetic:  1% lidocaine w/ epinephrine 1-100,000 local infiltration Instrument used: flexible razor blade   Outcome: patient tolerated procedure well   Post-procedure details: wound care instructions given    Specimen 1 - Surgical pathology Differential Diagnosis: scc vs bcc  Check Margins: No      I, Lavonna Monarch, MD, have reviewed all documentation for this visit.  The documentation on 10/19/20 for the exam, diagnosis, procedures, and orders are all accurate and complete.

## 2020-10-20 ENCOUNTER — Encounter: Payer: Self-pay | Admitting: Cardiology

## 2020-10-20 ENCOUNTER — Other Ambulatory Visit: Payer: Self-pay

## 2020-10-20 ENCOUNTER — Ambulatory Visit: Payer: BC Managed Care – PPO | Admitting: Cardiology

## 2020-10-20 VITALS — BP 114/72 | HR 76 | Ht 70.5 in | Wt 214.6 lb

## 2020-10-20 DIAGNOSIS — R072 Precordial pain: Secondary | ICD-10-CM

## 2020-10-20 DIAGNOSIS — E785 Hyperlipidemia, unspecified: Secondary | ICD-10-CM

## 2020-10-20 DIAGNOSIS — R0602 Shortness of breath: Secondary | ICD-10-CM | POA: Diagnosis not present

## 2020-10-20 NOTE — Addendum Note (Signed)
Addended by: Patria Mane A on: 10/20/2020 12:55 PM   Modules accepted: Orders

## 2020-10-20 NOTE — Patient Instructions (Addendum)
Medication Instructions:  Your physician recommends that you continue on your current medications as directed. Please refer to the Current Medication list given to you today.   Lab Work: BNP today  If you have labs (blood work) drawn today and your tests are completely normal, you will receive your results only by: Marland Kitchen MyChart Message (if you have MyChart) OR . A paper copy in the mail If you have any lab test that is abnormal or we need to change your treatment, we will call you to review the results.   Testing/Procedures: Your physician has requested that you have an exercise tolerance test. For further information please visit [email or web address]. Please also follow instruction sheet, as given. --this requires a covid test 3 days prior  CT coronary calcium score. This test is done at [address]. This is $99 out of pocket.   Coronary CalciumScan A coronary calcium scan is an imaging test used to look for deposits of calcium and other fatty materials (plaques) in the inner lining of the blood vessels of the heart (coronary arteries). These deposits of calcium and plaques can partly clog and narrow the coronary arteries without producing any symptoms or warning signs. This puts a person at risk for a heart attack. This test can detect these deposits before symptoms develop. Tell a health care provider about:  Any allergies you have.  All medicines you are taking, including vitamins, herbs, eye drops, creams, and over-the-counter medicines.  Any problems you or family members have had with anesthetic medicines.  Any blood disorders you have.  Any surgeries you have had.  Any medical conditions you have.  Whether you are pregnant or may be pregnant. What are the risks? Generally, this is a safe procedure. However, problems may occur, including:  Harm to a pregnant woman and her unborn baby. This test involves the use of radiation. Radiation exposure can be  dangerous to a pregnant woman and her unborn baby. If you are pregnant, you generally should not have this procedure done.  Slight increase in the risk of cancer. This is because of the radiation involved in the test. What happens before the procedure? No preparation is needed for this procedure. What happens during the procedure?  You will undress and remove any jewelry around your neck or chest.  You will put on a hospital gown.  Sticky electrodes will be placed on your chest. The electrodes will be connected to an electrocardiogram (ECG) machine to record a tracing of the electrical activity of your heart.  A CT scanner will take pictures of your heart. During this time, you will be asked to lie still and hold your breath for 2-3 seconds while a picture of your heart is being taken. The procedure may vary among health care providers and hospitals. What happens after the procedure?  You can get dressed.  You can return to your normal activities.  It is up to you to get the results of your test. Ask your health care provider, or the department that is doing the test, when your results will be ready. Summary  A coronary calcium scan is an imaging test used to look for deposits of calcium and other fatty materials (plaques) in the inner lining of the blood vessels of the heart (coronary arteries).  Generally, this is a safe procedure. Tell your health care provider if you are pregnant or may be pregnant.  No preparation is needed for this procedure.  A CT scanner will  take pictures of your heart.  You can return to your normal activities after the scan is done. This information is not intended to replace advice given to you by your health care provider. Make sure you discuss any questions you have with your health care provider. Document Released: 02/12/2008 Document Revised: 07/05/2016 Document Reviewed: 07/05/2016 Elsevier Interactive Patient Education  2017 [place]: At [facility], you and your health needs are our priority.  As part of our continuing mission to provide you with exceptional heart care, we have created designated Provider Care Teams.  These Care Teams include your primary Cardiologist (physician) and Advanced Practice Providers (APPs -  Physician Assistants and Nurse Practitioners) who all work together to provide you with the care you need, when you need it.  We recommend signing up for the patient portal called "MyChart".  Sign up information is provided on this After Visit Summary.  MyChart is used to connect with patients for Virtual Visits (Telemedicine).  Patients are able to view lab/test results, encounter notes, upcoming appointments, etc.  Non-urgent messages can be sent to your provider as well.   To learn more about what you can do with MyChart, go to [email or web address].    Your next appointment:    AS NEEDED with Dr. Percival Spanish

## 2020-10-21 ENCOUNTER — Other Ambulatory Visit (HOSPITAL_COMMUNITY)
Admission: RE | Admit: 2020-10-21 | Discharge: 2020-10-21 | Disposition: A | Discharge status: Home / Self Care | Payer: BC Managed Care – PPO | Source: Ambulatory Visit | Attending: Cardiology | Admitting: Cardiology

## 2020-10-21 DIAGNOSIS — Z0000 Encounter for general adult medical examination without abnormal findings: Secondary | ICD-10-CM | POA: Diagnosis not present

## 2020-10-21 DIAGNOSIS — Z01812 Encounter for preprocedural laboratory examination: Secondary | ICD-10-CM | POA: Insufficient documentation

## 2020-10-21 DIAGNOSIS — Z125 Encounter for screening for malignant neoplasm of prostate: Secondary | ICD-10-CM | POA: Diagnosis not present

## 2020-10-21 DIAGNOSIS — Z20822 Contact with and (suspected) exposure to covid-19: Secondary | ICD-10-CM | POA: Insufficient documentation

## 2020-10-21 DIAGNOSIS — E782 Mixed hyperlipidemia: Secondary | ICD-10-CM | POA: Diagnosis not present

## 2020-10-21 LAB — SARS CORONAVIRUS 2 (TAT 6-24 HRS): SARS Coronavirus 2: NEGATIVE

## 2020-10-21 LAB — BRAIN NATRIURETIC PEPTIDE: BNP: 3.1 pg/mL (ref 0.0–100.0)

## 2020-10-23 ENCOUNTER — Telehealth (HOSPITAL_COMMUNITY): Payer: Self-pay | Admitting: *Deleted

## 2020-10-23 NOTE — Telephone Encounter (Signed)
Close encounter 

## 2020-10-23 NOTE — Addendum Note (Signed)
Addended by: Patton Salles E on: 10/23/2020 09:00 AM   Modules accepted: Orders

## 2020-10-23 NOTE — Addendum Note (Signed)
Addended by: Minus Breeding on: 10/23/2020 09:08 AM   Modules accepted: Orders

## 2020-10-24 ENCOUNTER — Other Ambulatory Visit: Payer: Self-pay

## 2020-10-24 ENCOUNTER — Ambulatory Visit (HOSPITAL_COMMUNITY)
Admission: RE | Admit: 2020-10-24 | Discharge: 2020-10-24 | Disposition: A | Discharge status: Home / Self Care | Payer: BC Managed Care – PPO | Source: Ambulatory Visit | Attending: Cardiology | Admitting: Cardiology

## 2020-10-24 DIAGNOSIS — R0602 Shortness of breath: Secondary | ICD-10-CM | POA: Insufficient documentation

## 2020-10-24 LAB — EXERCISE TOLERANCE TEST
Estimated workload: 8.4 METS
Exercise duration (min): 6 min
Exercise duration (sec): 58 s
MPHR: 165 {beats}/min
Peak HR: 164 {beats}/min
Percent HR: 99 %
Rest HR: 64 {beats}/min

## 2020-11-18 ENCOUNTER — Other Ambulatory Visit: Payer: BC Managed Care – PPO

## 2020-12-02 ENCOUNTER — Ambulatory Visit (INDEPENDENT_AMBULATORY_CARE_PROVIDER_SITE_OTHER)
Admission: RE | Admit: 2020-12-02 | Discharge: 2020-12-02 | Disposition: A | Discharge status: Home / Self Care | Payer: Self-pay | Source: Ambulatory Visit | Attending: Cardiology | Admitting: Cardiology

## 2020-12-02 ENCOUNTER — Other Ambulatory Visit: Payer: Self-pay

## 2020-12-02 DIAGNOSIS — E785 Hyperlipidemia, unspecified: Secondary | ICD-10-CM

## 2021-05-25 DIAGNOSIS — H11042 Peripheral pterygium, stationary, left eye: Secondary | ICD-10-CM | POA: Diagnosis not present

## 2021-05-25 DIAGNOSIS — H04123 Dry eye syndrome of bilateral lacrimal glands: Secondary | ICD-10-CM | POA: Diagnosis not present

## 2021-07-10 DIAGNOSIS — L089 Local infection of the skin and subcutaneous tissue, unspecified: Secondary | ICD-10-CM | POA: Diagnosis not present

## 2021-09-18 DIAGNOSIS — Z0000 Encounter for general adult medical examination without abnormal findings: Secondary | ICD-10-CM | POA: Diagnosis not present

## 2021-09-21 DIAGNOSIS — E7800 Pure hypercholesterolemia, unspecified: Secondary | ICD-10-CM | POA: Diagnosis not present

## 2021-09-21 DIAGNOSIS — M79641 Pain in right hand: Secondary | ICD-10-CM | POA: Diagnosis not present

## 2021-09-21 DIAGNOSIS — Z0000 Encounter for general adult medical examination without abnormal findings: Secondary | ICD-10-CM | POA: Diagnosis not present

## 2021-09-21 DIAGNOSIS — Z131 Encounter for screening for diabetes mellitus: Secondary | ICD-10-CM | POA: Diagnosis not present

## 2021-09-21 DIAGNOSIS — Z125 Encounter for screening for malignant neoplasm of prostate: Secondary | ICD-10-CM | POA: Diagnosis not present

## 2021-10-01 DIAGNOSIS — H524 Presbyopia: Secondary | ICD-10-CM | POA: Diagnosis not present

## 2021-10-19 ENCOUNTER — Encounter: Payer: Self-pay | Admitting: Dermatology

## 2021-10-19 ENCOUNTER — Ambulatory Visit: Payer: BC Managed Care – PPO | Admitting: Dermatology

## 2021-10-19 ENCOUNTER — Other Ambulatory Visit: Payer: Self-pay

## 2021-10-19 DIAGNOSIS — L738 Other specified follicular disorders: Secondary | ICD-10-CM | POA: Diagnosis not present

## 2021-10-19 DIAGNOSIS — L729 Follicular cyst of the skin and subcutaneous tissue, unspecified: Secondary | ICD-10-CM | POA: Diagnosis not present

## 2021-10-19 DIAGNOSIS — Z1283 Encounter for screening for malignant neoplasm of skin: Secondary | ICD-10-CM | POA: Diagnosis not present

## 2021-10-19 MED ORDER — TRIAMCINOLONE ACETONIDE 10 MG/ML IJ SUSP
10.0000 mg | Freq: Once | INTRAMUSCULAR | Status: AC
  Administered 2021-10-19: 10 mg

## 2021-10-20 NOTE — Progress Notes (Signed)
° °  Follow-Up Visit   Subjective  Jimmy Dominguez is a 56 y.o. male who presents for the following: Annual Exam (Cyst on the back of neck x 6 weeks. Lesion on back x year. Personal history of bcc and melanoma. ).  Claimed spot back of the neck and skin check Location:  Duration:  Quality:  Associated Signs/Symptoms: Modifying Factors:  Severity:  Timing: Context:   Objective  Well appearing patient in no apparent distress; mood and affect are within normal limits. Scalp All skin waist up examined: No new or recurrent nonmelanoma skin cancer or atypical pigmented lesion.  Left Zygomatic Area, Neck - Posterior Smoldering 1 cm dermal nodule compatible with inflamed cyst.  Nonfluctuant.    All skin waist up examined.   Assessment & Plan    Encounter for screening for malignant neoplasm of skin Scalp  Annual skin examination, encouraged to self examine with spouse twice annually.  Cyst of skin (2) Neck - Posterior; Left Zygomatic Area  I discussed the optional future surgery when the active inflammation has subsided.  For now we will inject the spot in hopes that it will shrink or even disappear.  Intralesional injection - Neck - Posterior Location: neck  Informed Consent: Discussed risks (infection, pain, bleeding, bruising, thinning of the skin, loss of skin pigment, lack of resolution, and recurrence of lesion) and benefits of the procedure, as well as the alternatives. Informed consent was obtained. Preparation: The area was prepared a standard fashion.  Anesthesia:none  Procedure Details: An intralesional injection was performed with Kenalog 10 mg/cc. Marland Kitchen1 cc in total were injected.  Total number of injections: 1  Plan: The patient was instructed on post-op care. Recommend OTC analgesia as needed for pain.   Related Medications triamcinolone acetonide (KENALOG) 10 MG/ML injection 10 mg   Sebaceous gland hyperplasia Mid Forehead      I, Lavonna Monarch,  MD, have reviewed all documentation for this visit.  The documentation on 10/20/21 for the exam, diagnosis, procedures, and orders are all accurate and complete.

## 2021-12-25 DIAGNOSIS — L0390 Cellulitis, unspecified: Secondary | ICD-10-CM | POA: Diagnosis not present

## 2021-12-25 DIAGNOSIS — L089 Local infection of the skin and subcutaneous tissue, unspecified: Secondary | ICD-10-CM | POA: Diagnosis not present

## 2022-01-06 DIAGNOSIS — L089 Local infection of the skin and subcutaneous tissue, unspecified: Secondary | ICD-10-CM | POA: Diagnosis not present

## 2022-04-12 ENCOUNTER — Ambulatory Visit: Payer: BC Managed Care – PPO | Admitting: Dermatology

## 2022-10-12 DIAGNOSIS — Z0000 Encounter for general adult medical examination without abnormal findings: Secondary | ICD-10-CM | POA: Diagnosis not present

## 2022-10-13 DIAGNOSIS — Z0000 Encounter for general adult medical examination without abnormal findings: Secondary | ICD-10-CM | POA: Diagnosis not present

## 2022-10-13 DIAGNOSIS — Z125 Encounter for screening for malignant neoplasm of prostate: Secondary | ICD-10-CM | POA: Diagnosis not present

## 2022-10-13 DIAGNOSIS — Z1329 Encounter for screening for other suspected endocrine disorder: Secondary | ICD-10-CM | POA: Diagnosis not present

## 2022-10-13 DIAGNOSIS — R350 Frequency of micturition: Secondary | ICD-10-CM | POA: Diagnosis not present

## 2022-10-13 DIAGNOSIS — Z13220 Encounter for screening for lipoid disorders: Secondary | ICD-10-CM | POA: Diagnosis not present

## 2022-10-13 DIAGNOSIS — Z131 Encounter for screening for diabetes mellitus: Secondary | ICD-10-CM | POA: Diagnosis not present

## 2022-10-15 DIAGNOSIS — Z0000 Encounter for general adult medical examination without abnormal findings: Secondary | ICD-10-CM | POA: Diagnosis not present

## 2022-10-19 ENCOUNTER — Ambulatory Visit: Payer: BC Managed Care – PPO | Admitting: Dermatology

## 2022-10-29 DIAGNOSIS — R3129 Other microscopic hematuria: Secondary | ICD-10-CM | POA: Diagnosis not present

## 2022-11-10 DIAGNOSIS — H5213 Myopia, bilateral: Secondary | ICD-10-CM | POA: Diagnosis not present

## 2023-07-06 ENCOUNTER — Other Ambulatory Visit (HOSPITAL_COMMUNITY): Payer: Self-pay | Admitting: Family Medicine

## 2023-07-06 DIAGNOSIS — R220 Localized swelling, mass and lump, head: Secondary | ICD-10-CM

## 2023-07-07 ENCOUNTER — Ambulatory Visit (HOSPITAL_COMMUNITY)
Admission: RE | Admit: 2023-07-07 | Discharge: 2023-07-07 | Disposition: A | Discharge status: Home / Self Care | Payer: BC Managed Care – PPO | Source: Ambulatory Visit | Attending: Family Medicine | Admitting: Family Medicine

## 2023-07-07 DIAGNOSIS — R220 Localized swelling, mass and lump, head: Secondary | ICD-10-CM | POA: Diagnosis not present

## 2023-11-22 DIAGNOSIS — Z0000 Encounter for general adult medical examination without abnormal findings: Secondary | ICD-10-CM | POA: Diagnosis not present

## 2023-11-23 DIAGNOSIS — Z0000 Encounter for general adult medical examination without abnormal findings: Secondary | ICD-10-CM | POA: Diagnosis not present

## 2023-11-23 DIAGNOSIS — H524 Presbyopia: Secondary | ICD-10-CM | POA: Diagnosis not present

## 2023-11-23 DIAGNOSIS — H5212 Myopia, left eye: Secondary | ICD-10-CM | POA: Diagnosis not present

## 2024-08-01 DIAGNOSIS — S61213A Laceration without foreign body of left middle finger without damage to nail, initial encounter: Secondary | ICD-10-CM | POA: Diagnosis not present

## 2024-08-06 DIAGNOSIS — S61213D Laceration without foreign body of left middle finger without damage to nail, subsequent encounter: Secondary | ICD-10-CM | POA: Diagnosis not present
